# Patient Record
Sex: Male | Born: 1965 | Race: White | Hispanic: No | Marital: Married | Smoking: Never smoker
Health system: Southern US, Community
[De-identification: ages and names within clinical notes are randomized; demographics above are authoritative.]

---

## 2001-09-16 ENCOUNTER — Emergency Department (HOSPITAL_COMMUNITY): Admission: EM | Admit: 2001-09-16 | Discharge: 2001-09-16 | Payer: Self-pay | Admitting: Emergency Medicine

## 2001-09-16 ENCOUNTER — Encounter: Payer: Self-pay | Admitting: Emergency Medicine

## 2007-05-13 ENCOUNTER — Emergency Department (HOSPITAL_COMMUNITY): Admission: EM | Admit: 2007-05-13 | Discharge: 2007-05-14 | Payer: Self-pay | Admitting: Emergency Medicine

## 2012-10-17 ENCOUNTER — Emergency Department (HOSPITAL_COMMUNITY): Payer: Worker's Compensation | Admitting: Anesthesiology

## 2012-10-17 ENCOUNTER — Encounter (HOSPITAL_COMMUNITY): Payer: Self-pay | Admitting: *Deleted

## 2012-10-17 ENCOUNTER — Encounter (HOSPITAL_COMMUNITY): Payer: Self-pay | Admitting: Anesthesiology

## 2012-10-17 ENCOUNTER — Emergency Department (HOSPITAL_COMMUNITY)
Admission: EM | Admit: 2012-10-17 | Discharge: 2012-10-18 | Disposition: A | Discharge status: Home / Self Care | Payer: Worker's Compensation | Attending: Emergency Medicine | Admitting: Emergency Medicine

## 2012-10-17 ENCOUNTER — Encounter (HOSPITAL_COMMUNITY)
Admission: EM | Disposition: A | Discharge status: Home / Self Care | Payer: Self-pay | Source: Home / Self Care | Attending: Emergency Medicine

## 2012-10-17 ENCOUNTER — Emergency Department (HOSPITAL_COMMUNITY): Payer: Worker's Compensation

## 2012-10-17 ENCOUNTER — Inpatient Hospital Stay: Admit: 2012-10-17 | Payer: Self-pay | Admitting: Orthopedic Surgery

## 2012-10-17 DIAGNOSIS — IMO0002 Reserved for concepts with insufficient information to code with codable children: Secondary | ICD-10-CM | POA: Insufficient documentation

## 2012-10-17 DIAGNOSIS — S62639B Displaced fracture of distal phalanx of unspecified finger, initial encounter for open fracture: Secondary | ICD-10-CM | POA: Insufficient documentation

## 2012-10-17 DIAGNOSIS — Y9289 Other specified places as the place of occurrence of the external cause: Secondary | ICD-10-CM | POA: Insufficient documentation

## 2012-10-17 DIAGNOSIS — W230XXA Caught, crushed, jammed, or pinched between moving objects, initial encounter: Secondary | ICD-10-CM | POA: Insufficient documentation

## 2012-10-17 HISTORY — PX: PERCUTANEOUS PINNING: SHX2209

## 2012-10-17 SURGERY — PINNING, EXTREMITY, PERCUTANEOUS
Anesthesia: General | Site: Finger | Laterality: Right | Wound class: Dirty or Infected

## 2012-10-17 MED ORDER — HYDROMORPHONE HCL PF 1 MG/ML IJ SOLN
INTRAMUSCULAR | Status: AC
  Filled 2012-10-17: qty 1

## 2012-10-17 MED ORDER — HYDROCODONE-ACETAMINOPHEN 5-325 MG PO TABS: 1.0000 | ORAL_TABLET | ORAL | Status: DC | PRN

## 2012-10-17 MED ORDER — TETANUS-DIPHTH-ACELL PERTUSSIS 5-2.5-18.5 LF-MCG/0.5 IM SUSP
0.5000 mL | Freq: Once | INTRAMUSCULAR | Status: AC
  Administered 2012-10-17: 0.5 mL via INTRAMUSCULAR
  Filled 2012-10-17: qty 0.5

## 2012-10-17 MED ORDER — BUPIVACAINE-EPINEPHRINE 0.5% -1:200000 IJ SOLN
INTRAMUSCULAR | Status: DC | PRN
  Administered 2012-10-17: 8 mL

## 2012-10-17 MED ORDER — OXYCODONE-ACETAMINOPHEN 5-325 MG PO TABS: 1.0000 | ORAL_TABLET | Freq: Four times a day (QID) | ORAL | Status: DC | PRN

## 2012-10-17 MED ORDER — OXYCODONE-ACETAMINOPHEN 5-325 MG PO TABS
ORAL_TABLET | ORAL | Status: AC
  Filled 2012-10-17: qty 2

## 2012-10-17 MED ORDER — SODIUM CHLORIDE 0.9 % IV SOLN
INTRAVENOUS | Status: DC | PRN
  Administered 2012-10-17: 21:00:00 via INTRAVENOUS

## 2012-10-17 MED ORDER — ONDANSETRON HCL 4 MG/2ML IJ SOLN
INTRAMUSCULAR | Status: DC | PRN
  Administered 2012-10-17: 4 mg via INTRAVENOUS

## 2012-10-17 MED ORDER — CEPHALEXIN 500 MG PO CAPS: 500.0000 mg | ORAL_CAPSULE | Freq: Four times a day (QID) | ORAL | Status: DC

## 2012-10-17 MED ORDER — BUPIVACAINE-EPINEPHRINE (PF) 0.5% -1:200000 IJ SOLN
INTRAMUSCULAR | Status: AC
  Filled 2012-10-17: qty 10

## 2012-10-17 MED ORDER — HYDROMORPHONE HCL PF 1 MG/ML IJ SOLN
0.5000 mg | INTRAMUSCULAR | Status: DC | PRN
  Administered 2012-10-17 (×2): 0.5 mg via INTRAVENOUS

## 2012-10-17 MED ORDER — LACTATED RINGERS IV SOLN: INTRAVENOUS | Status: DC | PRN

## 2012-10-17 MED ORDER — FENTANYL CITRATE 0.05 MG/ML IJ SOLN
INTRAMUSCULAR | Status: DC | PRN
  Administered 2012-10-17: 50 ug via INTRAVENOUS
  Administered 2012-10-17: 100 ug via INTRAVENOUS
  Administered 2012-10-17 (×2): 50 ug via INTRAVENOUS

## 2012-10-17 MED ORDER — LIDOCAINE HCL (CARDIAC) 20 MG/ML IV SOLN
INTRAVENOUS | Status: DC | PRN
  Administered 2012-10-17: 100 mg via INTRAVENOUS

## 2012-10-17 MED ORDER — PROPOFOL 10 MG/ML IV BOLUS
INTRAVENOUS | Status: DC | PRN
  Administered 2012-10-17: 200 mg via INTRAVENOUS

## 2012-10-17 MED ORDER — 0.9 % SODIUM CHLORIDE (POUR BTL) OPTIME
TOPICAL | Status: DC | PRN
  Administered 2012-10-17: 1000 mL

## 2012-10-17 MED ORDER — DEXAMETHASONE SODIUM PHOSPHATE 4 MG/ML IJ SOLN
INTRAMUSCULAR | Status: DC | PRN
  Administered 2012-10-17: 4 mg via INTRAVENOUS

## 2012-10-17 MED ORDER — OXYCODONE-ACETAMINOPHEN 5-325 MG PO TABS
1.0000 | ORAL_TABLET | ORAL | Status: DC | PRN
  Administered 2012-10-17: 1 via ORAL

## 2012-10-17 MED ORDER — CEFAZOLIN SODIUM 1-5 GM-% IV SOLN
1.0000 g | Freq: Once | INTRAVENOUS | Status: AC
  Administered 2012-10-17: 1 g via INTRAVENOUS
  Filled 2012-10-17: qty 50

## 2012-10-17 SURGICAL SUPPLY — 28 items
BANDAGE ELASTIC 3 VELCRO ST LF (GAUZE/BANDAGES/DRESSINGS) ×3 IMPLANT
BANDAGE ELASTIC 4 VELCRO ST LF (GAUZE/BANDAGES/DRESSINGS) IMPLANT
BANDAGE GAUZE ELAST BULKY 4 IN (GAUZE/BANDAGES/DRESSINGS) ×1 IMPLANT
BLADE SURG ROTATE 9660 (MISCELLANEOUS) IMPLANT
BNDG COHESIVE 1X5 TAN STRL LF (GAUZE/BANDAGES/DRESSINGS) ×2 IMPLANT
CHLORAPREP W/TINT 10.5 ML (MISCELLANEOUS) ×1 IMPLANT
CLOTH BEACON ORANGE TIMEOUT ST (SAFETY) ×3 IMPLANT
COVER SURGICAL LIGHT HANDLE (MISCELLANEOUS) ×3 IMPLANT
CUFF TOURNIQUET SINGLE 18IN (TOURNIQUET CUFF) IMPLANT
CUFF TOURNIQUET SINGLE 24IN (TOURNIQUET CUFF) ×2 IMPLANT
DRSG EMULSION OIL 3X3 NADH (GAUZE/BANDAGES/DRESSINGS) ×2 IMPLANT
GAUZE XEROFORM 1X8 LF (GAUZE/BANDAGES/DRESSINGS) IMPLANT
GLOVE BIO SURGEON STRL SZ7.5 (GLOVE) ×3 IMPLANT
GLOVE BIOGEL PI IND STRL 8 (GLOVE) ×2 IMPLANT
GLOVE BIOGEL PI INDICATOR 8 (GLOVE) ×1
GOWN STRL NON-REIN LRG LVL3 (GOWN DISPOSABLE) ×6 IMPLANT
KIT BASIN OR (CUSTOM PROCEDURE TRAY) ×3 IMPLANT
KIT ROOM TURNOVER OR (KITS) ×3 IMPLANT
MANIFOLD NEPTUNE II (INSTRUMENTS) ×1 IMPLANT
NS IRRIG 1000ML POUR BTL (IV SOLUTION) ×3 IMPLANT
PACK ORTHO EXTREMITY (CUSTOM PROCEDURE TRAY) ×3 IMPLANT
PAD ARMBOARD 7.5X6 YLW CONV (MISCELLANEOUS) ×6 IMPLANT
SPONGE GAUZE 4X4 12PLY (GAUZE/BANDAGES/DRESSINGS) ×2 IMPLANT
SUT CHROMIC 6 0 TG140 8 (SUTURE) ×2 IMPLANT
SUT VICRYL RAPIDE 4/0 PS 2 (SUTURE) ×2 IMPLANT
TOWEL OR 17X24 6PK STRL BLUE (TOWEL DISPOSABLE) ×3 IMPLANT
TOWEL OR 17X26 10 PK STRL BLUE (TOWEL DISPOSABLE) ×3 IMPLANT
WATER STERILE IRR 1000ML POUR (IV SOLUTION) ×1 IMPLANT

## 2012-10-17 NOTE — Anesthesia Postprocedure Evaluation (Signed)
  Anesthesia Post-op Note  Patient: Hunter Jenkins  Procedure(s) Performed: Procedure(s): PERCUTANEOUS PINNING RIGHT MIDDLE FINGER (Right)  Patient Location: PACU  Anesthesia Type:GA combined with regional for post-op pain  Level of Consciousness: awake and alert   Airway and Oxygen Therapy: Patient Spontanous Breathing  Post-op Pain: none  Post-op Assessment: Post-op Vital signs reviewed, Patient's Cardiovascular Status Stable, Respiratory Function Stable, Patent Airway, No signs of Nausea or vomiting and Pain level controlled  Post-op Vital Signs: Reviewed and stable  Complications: No apparent anesthesia complications

## 2012-10-17 NOTE — ED Notes (Signed)
Patient is having his hand soaked with sterile water and betadine.  He is comfortable at this time.

## 2012-10-17 NOTE — ED Notes (Signed)
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

## 2012-10-17 NOTE — ED Provider Notes (Signed)
CSN: [id number]     Arrival date & time 10/17/12  1631 History   First MD Initiated Contact with Patient 10/17/12 1709     Chief Complaint  Patient presents with  . Finger Injury   (Consider location/radiation/quality/duration/timing/severity/associated sxs/prior Treatment) HPI Comments: 47 year old male presenting to the emergency department with a right finger injury occurring around 4:00 PM today, about an hour and a half prior to arrival to the emergency department. 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 Currently states his pain is not that bad, his finger feels slightly numb. Believes his last tetanus shot was about 5 years ago.  The history is provided by the patient.    History reviewed. No pertinent past medical history. History reviewed. No pertinent past surgical history. History reviewed. No pertinent family history. History  Substance Use Topics  . Smoking status: Never Smoker   . Smokeless tobacco: Not on file  . Alcohol Use: No    Review of Systems  Gastrointestinal: Negative for nausea.  Skin: Positive for wound.  Neurological: Positive for numbness. Negative for light-headedness.  All other systems reviewed and are negative.    Allergies  Aleve  Home Medications  No current outpatient prescriptions on file. BP 165/94  Pulse 89  Temp(Src) 98 F (36.7 C) (Oral)  Resp 18  Ht 6\' 1"  (1.854 m)  Wt 263 lb (119.296 kg)  BMI 34.71 kg/m2  SpO2 96% Physical Exam  Nursing note and vitals reviewed. Constitutional: He is oriented to person, place, and time. He appears well-developed and well-nourished.  HENT:  Head: Normocephalic and atraumatic.  Eyes: Conjunctivae are normal.  Neck: Normal range of motion. Neck supple.  Cardiovascular: Normal rate, regular rhythm, normal heart sounds, intact distal pulses and normal pulses.   Pulmonary/Chest: Effort  normal and breath sounds normal.  Musculoskeletal:       Right hand: He exhibits laceration. Normal sensation noted.       Hands: Full flexion and extension of DIP of right middle finger.  Neurological: He is alert and oriented to person, place, and time. No sensory deficit.  Psychiatric: He has a normal mood and affect. His behavior is normal.    ED Course  Procedures (including critical care time) Digital block, 4 cc xylocaine Labs Review Labs Reviewed - No data to display Imaging Review Dg Finger Middle Right  10/17/2012   CLINICAL DATA:  Right middle finger pain following an injury.  EXAM: RIGHT MIDDLE FINGER 2+V  COMPARISON:  None.  FINDINGS: Transverse fracture through the proximal aspect of the 3rd distal tuft and adjacent distal aspect of the 3rd distal phalanx. This is mildly comminuted with 1 shaft width of dorsal displacement of the distal fragment. Overlying bandage material.  IMPRESSION: Comminuted 3rd distal phalanx fracture, as described above.   Electronically Signed   By: Gordan Payment   On: 10/17/2012 17:19    MDM   1. Phalanx, distal fracture of finger, open, initial encounter     Patient with laceration to the distal tip of his right middle finger with comminuted distal phalanx fracture. Partial amputation. Digital block given, wound cleaned. I spoke with Dr. Janee Morn, hand surgeon who looked at the x-ray and will come to the emergency department to evaluate patient himself. Neurovascularly intact. He is refusing any pain medication at this time. Patient also evaluated by my attending Dr. Romeo Apple who agrees with  plan of care.  8:57 PM Patient evaluated by Dr. Janee Morn who will take patient to OR for surgery. IV ancef.   Trevor Mace, PA-C 10/17/12 2059  Trevor Mace, PA-C 10/17/12 2109

## 2012-10-17 NOTE — ED Notes (Signed)
Janee Morn, MD is at the bedside.

## 2012-10-17 NOTE — Anesthesia Preprocedure Evaluation (Addendum)
Anesthesia Evaluation  Patient identified by MRN, date of birth, ID band Patient awake    Reviewed: Allergy & Precautions, H&P , NPO status , Patient's Chart, lab work & pertinent test results  History of Anesthesia Complications (+) PONV  Airway Mallampati: II TM Distance: >3 FB Neck ROM: Full    Dental   Pulmonary  breath sounds clear to auscultation        Cardiovascular Rhythm:Regular Rate:Normal     Neuro/Psych    GI/Hepatic   Endo/Other    Renal/GU      Musculoskeletal   Abdominal (+) + obese,   Peds  Hematology   Anesthesia Other Findings   Reproductive/Obstetrics                         Anesthesia Physical Anesthesia Plan  ASA: I and emergent  Anesthesia Plan: General   Post-op Pain Management:    Induction: Intravenous  Airway Management Planned: LMA  Additional Equipment:   Intra-op Plan:   Post-operative Plan: Extubation in OR  Informed Consent: I have reviewed the patients History and Physical, chart, labs and discussed the procedure including the risks, benefits and alternatives for the proposed anesthesia with the patient or authorized representative who has indicated his/her understanding and acceptance.     Plan Discussed with: CRNA and Surgeon  Anesthesia Plan Comments:        Anesthesia Quick Evaluation

## 2012-10-17 NOTE — Op Note (Signed)
10/17/2012  10:30 PM  PATIENT:  Hunter Jenkins  47 y.o. male  PRE-OPERATIVE DIAGNOSIS:  Right long finger incomplete tip amputation  POST-OPERATIVE DIAGNOSIS:  Same  PROCEDURE:   1. Right long finger nail plate avulsion    2. Right long finger nail bed repair    3. Right long finger debridement of open fracture to include skin subcutaneous tissue and bone    4. Right long finger ORIF distal phalanx fracture  SURGEON: Cliffton Asters. Janee Morn, MD  PHYSICIAN ASSISTANT: None  ANESTHESIA:  general  SPECIMENS:  None  DRAINS:   None  PREOPERATIVE INDICATIONS:  Hunter Jenkins is a  47 y.o. male with a dramatic incomplete amputation of right long finger  The risks benefits and alternatives were discussed with the patient preoperatively including but not limited to the risks of infection, bleeding, nerve injury, cardiopulmonary complications, the need for revision surgery, among others, and the patient verbalized understanding and consented to proceed.  OPERATIVE IMPLANTS: 0.045 inch K wire x2   OPERATIVE FINDINGS: Dorsal disruption through the junction where terminal matrix need sterile matrix. Nailbed laceration was complex and stellate through this area  OPERATIVE PROCEDURE:  After receiving prophylactic antibiotics, the patient was escorted to the operative theatre and placed in a supine position.  General anesthesia was administered. A surgical "time-out" was performed during which the planned procedure, proposed operative site, and the correct patient identity were compared to the operative consent and agreement confirmed by the circulating nurse according to current facility policy.  Following application of a tourniquet to the operative extremity, the exposed skin was prepped with a Hibiclens scrub brush before being formally prepped with Betadine and draped in the usual sterile fashion.  The tourniquet inflated to approximately higher than systolic BP.  The nail plate was removed from  the distal fragment by using a Freer to separate it from the nail bed. It was cleaned of any cleaning debris and placed on the back table in saline. The fracture site was debrided. There was some cancellous bone fragments that needed to be removed. Skin edges were trimmed. Subcutaneous tissue was debrided. The wound is copiously irrigated. A 0.045 inch K wire was then driven retrograde out the distal end so that the fracture to be reduced and it to be driven back across into the base fragment up to the subchondral bone. A second K wire was placed from distal toward proximal crossing, securing the fracture nicely. Alignment was near-anatomic. Final fluoroscopic images were obtained. The soft tissues adjacent to the nailbed  were repaired with 4-0 Vicryl Rapide interrupted sutures. The nail bed repair was performed with 4 power loupe magnification and 6-0 chromic suture. The nail plate was then placed back into the depths of the nail fold and secured distally on the nail folds with 4-0 Vicryl Rapide interrupted sutures. The K wires were bent over distally to lie dorsally adjacent to the nail and clipped. The tourniquet was released and a dressing applied with a dorsal tongue blade that did not immobilize the PIP joint.  He was awakened and taken to the recovery room in stable condition, breathing spontaneously  DISPOSITION: He will be discharged home today returning in 10-15 days for reevaluation, with new x-rays of the right long finger in the splint and a companion appointment with hand therapy to construct a fingertip splint.

## 2012-10-17 NOTE — Transfer of Care (Signed)
Immediate Anesthesia Transfer of Care Note  Patient: Hunter Jenkins  Procedure(s) Performed: Procedure(s): PERCUTANEOUS PINNING RIGHT MIDDLE FINGER (Right)  Patient Location: PACU  Anesthesia Type:General  Level of Consciousness: awake and alert   Airway & Oxygen Therapy: Patient Spontanous Breathing and Patient connected to nasal cannula oxygen  Post-op Assessment: Report given to PACU RN, Post -op Vital signs reviewed and stable and Patient moving all extremities X 4  Post vital signs: Reviewed and stable  Complications: No apparent anesthesia complications

## 2012-10-17 NOTE — Consult Note (Signed)
  ORTHOPAEDIC CONSULTATION HISTORY & PHYSICAL REQUESTING PHYSICIAN: Hunter Argyle, MD  Chief Complaint: Right long fingertip injury  HPI: Hunter Jenkins is a 47 y.o. male who was at work and was spinning entire, getting the tip of the long finger caught in the limb. It caused an incomplete amputation of the tip through the distal phalanx.  History reviewed. No pertinent past medical history. History reviewed. No pertinent past surgical history. History   Social History  . Marital Status: Married    Spouse Name: N/A    Number of Children: N/A  . Years of Education: N/A   Social History Main Topics  . Smoking status: Never Smoker   . Smokeless tobacco: None  . Alcohol Use: No  . Drug Use: No  . Sexual Activity: None   Other Topics Concern  . None   Social History Narrative  . None   History reviewed. No pertinent family history. Allergies  Allergen Reactions  . Aleve [Naproxen Sodium] Rash    Pt has taken higher doses of generic naproxen with no reaction   Prior to Admission medications   Medication Sig Start Date End Date Taking? Authorizing Provider  diphenhydramine-acetaminophen (TYLENOL PM) 25-500 MG TABS Take 2 tablets by mouth at bedtime as needed (pain/sleep).   Yes Historical Provider, MD   Dg Finger Middle Right  10/17/2012   CLINICAL DATA:  Right middle finger pain following an injury.  EXAM: RIGHT MIDDLE FINGER 2+V  COMPARISON:  None.  FINDINGS: Transverse fracture through the proximal aspect of the 3rd distal tuft and adjacent distal aspect of the 3rd distal phalanx. This is mildly comminuted with 1 shaft width of dorsal displacement of the distal fragment. Overlying bandage material.  IMPRESSION: Comminuted 3rd distal phalanx fracture, as described above.   Electronically Signed   By: Gordan Payment   On: 10/17/2012 17:19    Positive ROS: All other systems have been reviewed and were otherwise negative with the exception of those mentioned in the HPI and as  above.  Physical Exam: Vitals: Refer to EMR. Constitutional:  WD, WN, NAD HEENT:  NCAT, EOMI Neuro/Psych:  Alert & oriented to person, place, and time; appropriate mood & affect Lymphatic: No generalized UE edema or lymphadenopathy Extremities / MSK:  The extremities are normal with respect to appearance, ranges of motion, joint stability, muscle strength/tone, sensation, & perfusion except as otherwise noted:   Right long finger has a circumferential 270 breech in the soft tissues with a volar ulnar bridge intact. The part has decent turgor. It is unclear at what level this affected the nail bed, whether germinal or sterile matrix. The nail is intact to the distal piece and has become avulsed from the proximal piece. Flexor and extensor tendons are intact  Assessment: Right long finger incomplete amputation through distal phalanx  Plan: I discussed these findings with the patient and recommended exploration in the operating room with repair of structures as indicated to include pinning of the fracture and repair the soft tissues. We discussed the possibility that the part may go on to die from poor perfusion, require revision surgery or even completion of the amputation. Nonetheless, it is prudent tonight to try to salvage the finger with direct repair.  [name], MD     Mobile [phone] Orthopaedic & Hand Surgery [facility] Orthopaedic & Sports Medicine [facility] [address] [place], [place]  [address] [phone]

## 2012-10-17 NOTE — Anesthesia Procedure Notes (Signed)
Procedure Name: LMA Insertion Date/Time: 10/17/2012 9:31 PM Performed by: Orvilla Fus A Pre-anesthesia Checklist: Patient identified, Timeout performed, Emergency Drugs available, Suction available and Patient being monitored Patient Re-evaluated:Patient Re-evaluated prior to inductionOxygen Delivery Method: Circle system utilized Preoxygenation: Pre-oxygenation with 100% oxygen Intubation Type: IV induction LMA: LMA inserted LMA Size: 5.0 Number of attempts: 1 Placement Confirmation: positive ETCO2 and breath sounds checked- equal and bilateral Tube secured with: Tape Dental Injury: Teeth and Oropharynx as per pre-operative assessment

## 2012-10-18 MED ORDER — ONDANSETRON HCL 4 MG/2ML IJ SOLN
INTRAMUSCULAR | Status: AC
  Filled 2012-10-18: qty 2

## 2012-10-18 MED ORDER — ONDANSETRON HCL 4 MG/2ML IJ SOLN: 4.0000 mg | Freq: Once | INTRAMUSCULAR | Status: DC

## 2012-10-18 NOTE — OR Nursing (Signed)
Pt w/ N/V before tx.   Zofran 4mg  IV given per orders.

## 2012-10-18 NOTE — ED Provider Notes (Signed)
Medical screening examination/treatment/procedure(s) were conducted as a shared visit with non-physician practitioner(s) and myself.  I personally evaluated the patient during the encounter  I interviewed and examined the patient. Lungs are CTAB. Cardiac exam wnl. Abdomen soft.  Partial amputation of right long finger. Consulted hand surg. Pt taken to OR.    [name], MD 10/18/12 1359

## 2012-10-22 ENCOUNTER — Encounter (HOSPITAL_COMMUNITY): Payer: Self-pay | Admitting: Orthopedic Surgery

## 2013-09-11 ENCOUNTER — Encounter (HOSPITAL_COMMUNITY): Payer: Self-pay | Admitting: Emergency Medicine

## 2013-09-11 ENCOUNTER — Emergency Department (INDEPENDENT_AMBULATORY_CARE_PROVIDER_SITE_OTHER)
Admission: EM | Admit: 2013-09-11 | Discharge: 2013-09-11 | Disposition: A | Discharge status: Home / Self Care | Payer: BC Managed Care – PPO | Source: Home / Self Care

## 2013-09-11 DIAGNOSIS — S61409A Unspecified open wound of unspecified hand, initial encounter: Secondary | ICD-10-CM

## 2013-09-11 DIAGNOSIS — S61411A Laceration without foreign body of right hand, initial encounter: Secondary | ICD-10-CM

## 2013-09-11 NOTE — ED Provider Notes (Signed)
CSN: [id number]     Arrival date & time 09/11/13  1810 History   First MD Initiated Contact with Patient 09/11/13 1828     Chief Complaint  Patient presents with  . Laceration   (Consider location/radiation/quality/duration/timing/severity/associated sxs/prior Treatment) HPI Comments: 48 year old male was at his job trying to pull a tire off a metal support object. The right hand brushed across the edge of the metal producing a 3 cm laceration to the dorsum of the right hand.   History reviewed. No pertinent past medical history. Past Surgical History  Procedure Laterality Date  . Percutaneous pinning Right 10/17/2012    Procedure: PERCUTANEOUS PINNING RIGHT MIDDLE FINGER;  Surgeon: Jodi Marbleavid A Thompson, MD;  Location: [facility] OR;  Service: Orthopedics;  Laterality: Right;   History reviewed. No pertinent family history. History  Substance Use Topics  . Smoking status: Never Smoker   . Smokeless tobacco: Not on file  . Alcohol Use: No    Review of Systems  Skin: Positive for wound.  All other systems reviewed and are negative.   Allergies  Aleve  Home Medications   Prior to Admission medications   Medication Sig Start Date End Date Taking? Authorizing Provider  triamcinolone (NASACORT) 55 MCG/ACT AERO nasal inhaler Place 2 sprays into the nose daily.   Yes Historical Provider, MD   BP 167/93  Pulse 80  Temp(Src) 99 F (37.2 C) (Oral)  Resp 20  SpO2 96% Physical Exam  Nursing note and vitals reviewed. Constitutional: He is oriented to person, place, and time. He appears well-developed and well-nourished. No distress.  Neck: Normal range of motion. Neck supple.  Cardiovascular: Normal rate.   Pulmonary/Chest: Effort normal. No respiratory distress.  Musculoskeletal:  Full extension and flexion against resistance of the right index finger and thumb. Full range of motion. Distal neurovascular motor sensory is intact. Capillary refill brisk.  Neurological: He is alert and  oriented to person, place, and time. He exhibits normal muscle tone.  Skin: Skin is warm and dry.  3 cm laceration to the dorsum of the right hand just proximal to the first interdigital web space. It is superficial. The laceration includes the dermis and a portion of the subcutaneous tissue but does not involve tendons.  Psychiatric: He has a normal mood and affect.    ED Course  LACERATION REPAIR Date/Time: 09/11/2013 7:23 PM Performed by: Phineas RealMABE, Yalda Herd Authorized by: Leslee HomeKELLER, Jasan Doughtie C Consent: Verbal consent obtained. Risks and benefits: risks, benefits and alternatives were discussed Consent given by: patient Patient understanding: patient states understanding of the procedure being performed Patient identity confirmed: verbally with patient Body area: upper extremity Location details: right hand Laceration length: 3 cm Tendon involvement: none Nerve involvement: none Vascular damage: no Anesthesia: local infiltration Local anesthetic: lidocaine 1% with epinephrine Anesthetic total: 7 ml Preparation: Patient was prepped and draped in the usual sterile fashion. Irrigation solution: saline Irrigation method: jet lavage Amount of cleaning: standard Debridement: none Degree of undermining: none Skin closure: 4-0 nylon Number of sutures: 7 Technique: simple Approximation: close Approximation difficulty: simple Dressing: 4x4 sterile gauze Patient tolerance: Patient tolerated the procedure well with no immediate complications.   (including critical care time) Labs Review Labs Reviewed - No data to display  Imaging Review No results found.   MDM   1. Laceration of hand without complication, excluding fingers, right, initial encounter    Sutured laceration RTO 10 d for suture removal, sooner for problems    Hayden Rasmussenavid Mckinsley Koelzer, NP 09/11/13 1925  Onalee Huaavid  Doyne Micke, NP 09/11/13 1926

## 2013-09-11 NOTE — ED Provider Notes (Signed)
Medical screening examination/treatment/procedure(s) were performed by non-physician practitioner and as supervising physician I was immediately available for consultation/collaboration.  Caera Enwright, M.D.  Qiara Minetti C Agnes Probert, MD 09/11/13 2259 

## 2013-09-11 NOTE — Discharge Instructions (Signed)
Laceration Care, Adult °A laceration is a cut or lesion that goes through all layers of the skin and into the tissue just beneath the skin. °TREATMENT  °Some lacerations may not require closure. Some lacerations may not be able to be closed due to an increased risk of infection. It is important to see your caregiver as soon as possible after an injury to minimize the risk of infection and maximize the opportunity for successful closure. °If closure is appropriate, pain medicines may be given, if needed. The wound will be cleaned to help prevent infection. Your caregiver will use stitches (sutures), staples, wound glue (adhesive), or skin adhesive strips to repair the laceration. These tools bring the skin edges together to allow for faster healing and a better cosmetic outcome. However, all wounds will heal with a scar. Once the wound has healed, scarring can be minimized by covering the wound with sunscreen during the day for 1 full year. °HOME CARE INSTRUCTIONS  °For sutures or staples: °· Keep the wound clean and dry. °· If you were given a bandage (dressing), you should change it at least once a day. Also, change the dressing if it becomes wet or dirty, or as directed by your caregiver. °· Wash the wound with soap and water 2 times a day. Rinse the wound off with water to remove all soap. Pat the wound dry with a clean towel. °· After cleaning, apply a thin layer of the antibiotic ointment as recommended by your caregiver. This will help prevent infection and keep the dressing from sticking. °· You may shower as usual after the first 24 hours. Do not soak the wound in water until the sutures are removed. °· Only take over-the-counter or prescription medicines for pain, discomfort, or fever as directed by your caregiver. °· Get your sutures or staples removed as directed by your caregiver. °For skin adhesive strips: °· Keep the wound clean and dry. °· Do not get the skin adhesive strips wet. You may bathe  carefully, using caution to keep the wound dry. °· If the wound gets wet, pat it dry with a clean towel. °· Skin adhesive strips will fall off on their own. You may trim the strips as the wound heals. Do not remove skin adhesive strips that are still stuck to the wound. They will fall off in time. °For wound adhesive: °· You may briefly wet your wound in the shower or bath. Do not soak or scrub the wound. Do not swim. Avoid periods of heavy perspiration until the skin adhesive has fallen off on its own. After showering or bathing, gently pat the wound dry with a clean towel. °· Do not apply liquid medicine, cream medicine, or ointment medicine to your wound while the skin adhesive is in place. This may loosen the film before your wound is healed. °· If a dressing is placed over the wound, be careful not to apply tape directly over the skin adhesive. This may cause the adhesive to be pulled off before the wound is healed. °· Avoid prolonged exposure to sunlight or tanning lamps while the skin adhesive is in place. Exposure to ultraviolet light in the first year will darken the scar. °· The skin adhesive will usually remain in place for 5 to 10 days, then naturally fall off the skin. Do not pick at the adhesive film. °You may need a tetanus shot if: °· You cannot remember when you had your last tetanus shot. °· You have never had a tetanus   shot. °If you get a tetanus shot, your arm may swell, get red, and feel warm to the touch. This is common and not a problem. If you need a tetanus shot and you choose not to have one, there is a rare chance of getting tetanus. Sickness from tetanus can be serious. °SEEK MEDICAL CARE IF:  °· You have redness, swelling, or increasing pain in the wound. °· You see a red line that goes away from the wound. °· You have yellowish-white fluid (pus) coming from the wound. °· You have a fever. °· You notice a bad smell coming from the wound or dressing. °· Your wound breaks open before or  after sutures have been removed. °· You notice something coming out of the wound such as wood or glass. °· Your wound is on your hand or foot and you cannot move a finger or toe. °SEEK IMMEDIATE MEDICAL CARE IF:  °· Your pain is not controlled with prescribed medicine. °· You have severe swelling around the wound causing pain and numbness or a change in color in your arm, hand, leg, or foot. °· Your wound splits open and starts bleeding. °· You have worsening numbness, weakness, or loss of function of any joint around or beyond the wound. °· You develop painful lumps near the wound or on the skin anywhere on your body. °MAKE SURE YOU:  °· Understand these instructions. °· Will watch your condition. °· Will get help right away if you are not doing well or get worse. °Document Released: 01/09/2005 Document Revised: 04/03/2011 Document Reviewed: 07/05/2010 °[employer]® Patient Information ©2015 [employer]. This information is not intended to replace advice given to you by your health care provider. Make sure you discuss any questions you have with your health care provider. ° °Sutured Wound Care °Sutures are stitches that can be used to close wounds. Caring for your wound can help stop infection and lessen pain. °HOME CARE  °· Rest and raise (elevate) the injured area until the pain and puffiness (swelling) go away. °· Only take medicines as told by your doctor. °· Clean the wound gently with mild soap and water once a day after the first 2 days. Rinse off the soap. Pat the area dry with a clean towel. Do not rub the wound. °· Change the bandage (dressing) as told by your doctor. If the bandage sticks, soak it off with soapy water. Stop using a bandage after 2 days or after the wound stops leaking fluid. °· Put cream on the wound as told by your doctor. °· Do not stretch the wound. °· Drink enough fluids to keep your pee (urine) clear or pale yellow. °· See your doctor to have the sutures removed. °· Use sunscreen or  sunblock on the wound after it heals. °GET HELP RIGHT AWAY IF:  °· Your wound gets red, puffy, hot, or tender. °· You have more pain in the wound. °· You have a red streak that goes away from the wound. °· You see yellowish-white fluid (pus) coming out of the wound. °· You have a fever. °· You have chills and start to shake. °· You notice a bad smell coming from the wound. °· Your wound will not stop bleeding. °MAKE SURE YOU:  °· Understand these instructions. °· Will watch your condition. °· Will get help right away if you are not doing well or get worse. °Document Released: 06/28/2007 Document Revised: 04/03/2011 Document Reviewed: 05/15/2010 °[employer]® Patient Information ©2015 [employer]. This information is   not intended to replace advice given to you by your health care provider. Make sure you discuss any questions you have with your health care provider. ° °

## 2013-09-11 NOTE — ED Notes (Signed)
Reports laceration to the top of the right hand over index finger.  States cut hand on a metal hand dolly.   Pt is up to date on tetanus.

## 2014-11-23 IMAGING — CR DG FINGER MIDDLE 2+V*R*
3 series · 3 of 3 positions shown · non-contrast
Comparison: None.

CLINICAL DATA: Right middle finger pain following an injury.

EXAM:
RIGHT MIDDLE FINGER 2+V

[x finger pa right]
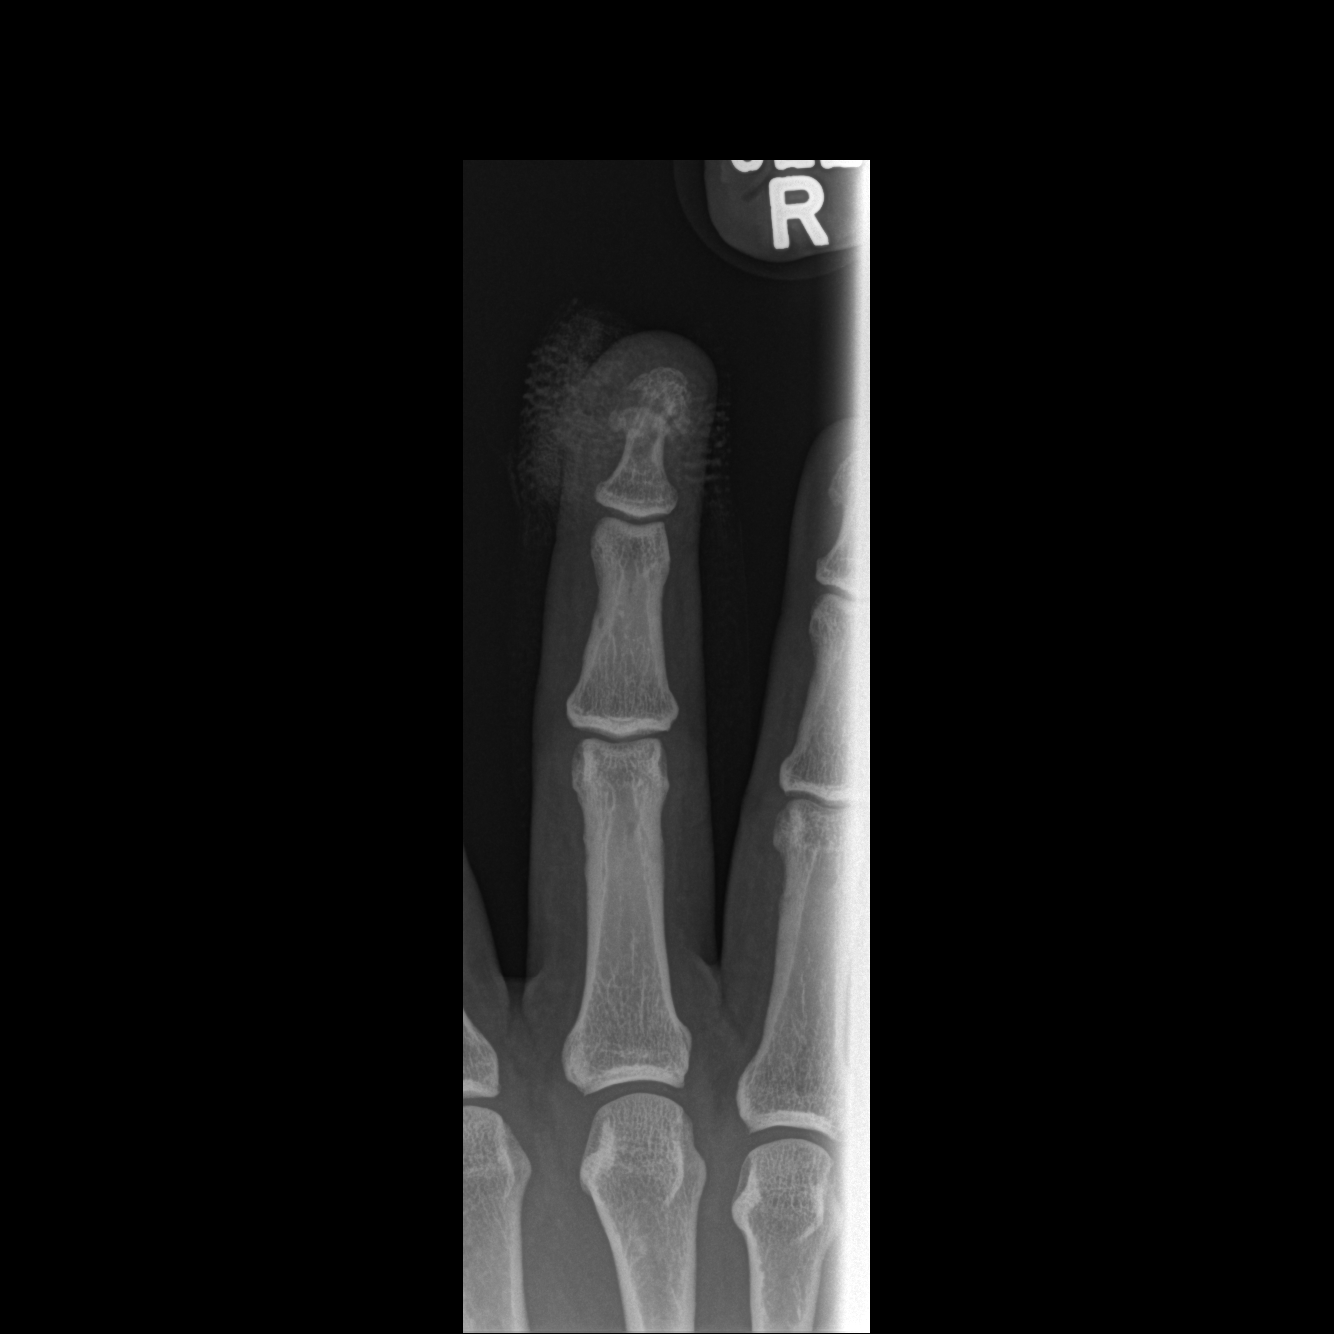

[x finger obl. right]
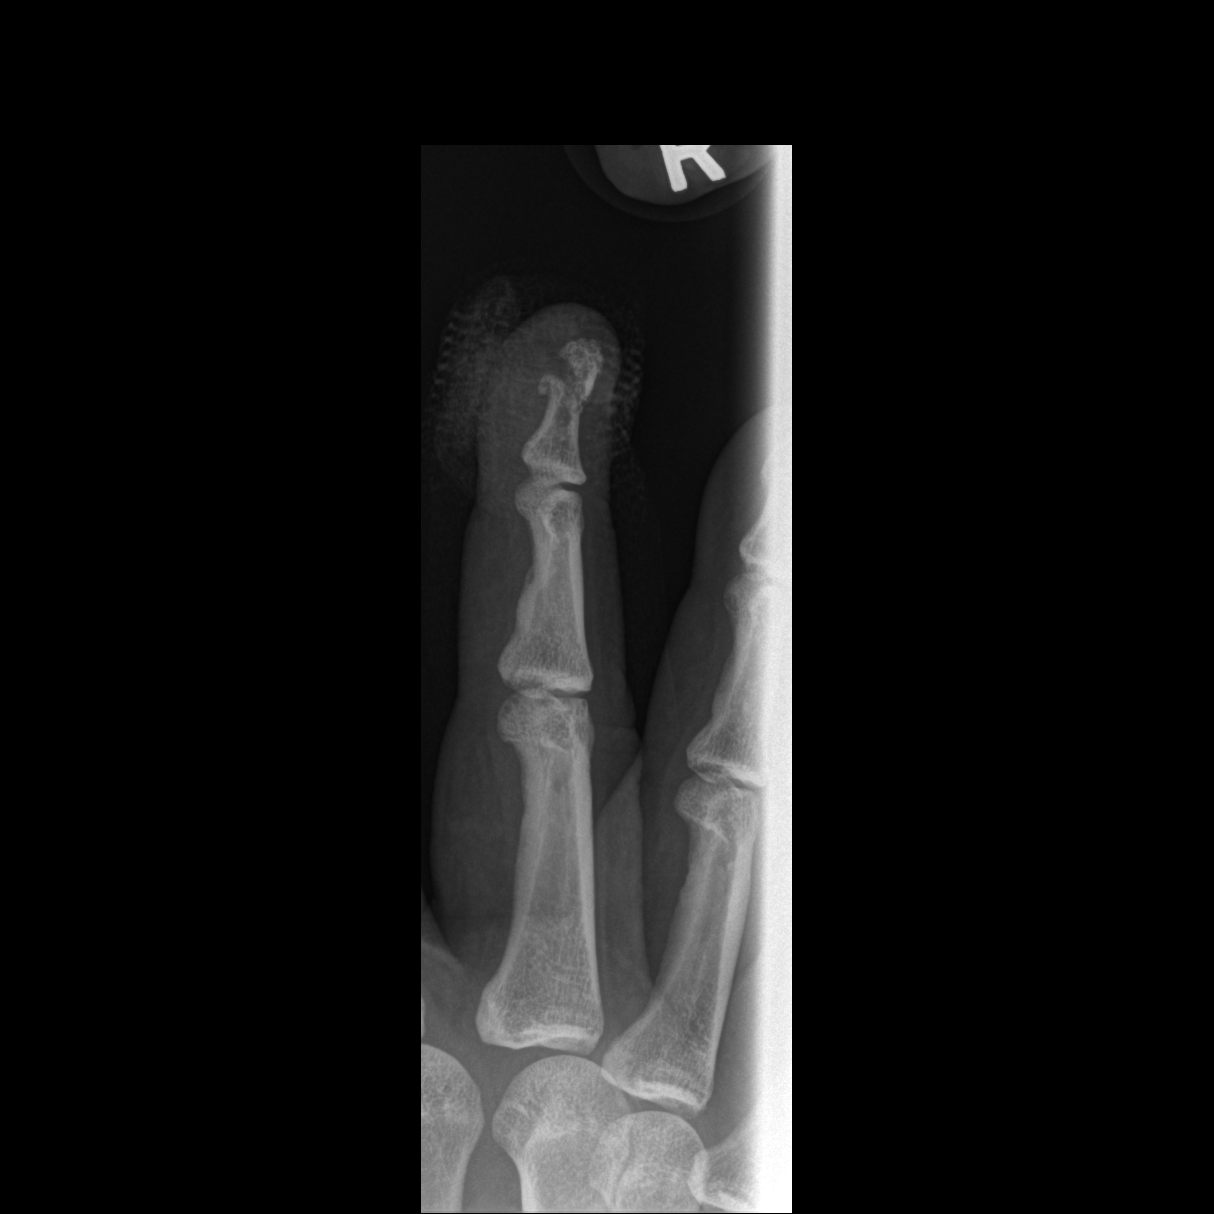

[x finger lateral right]
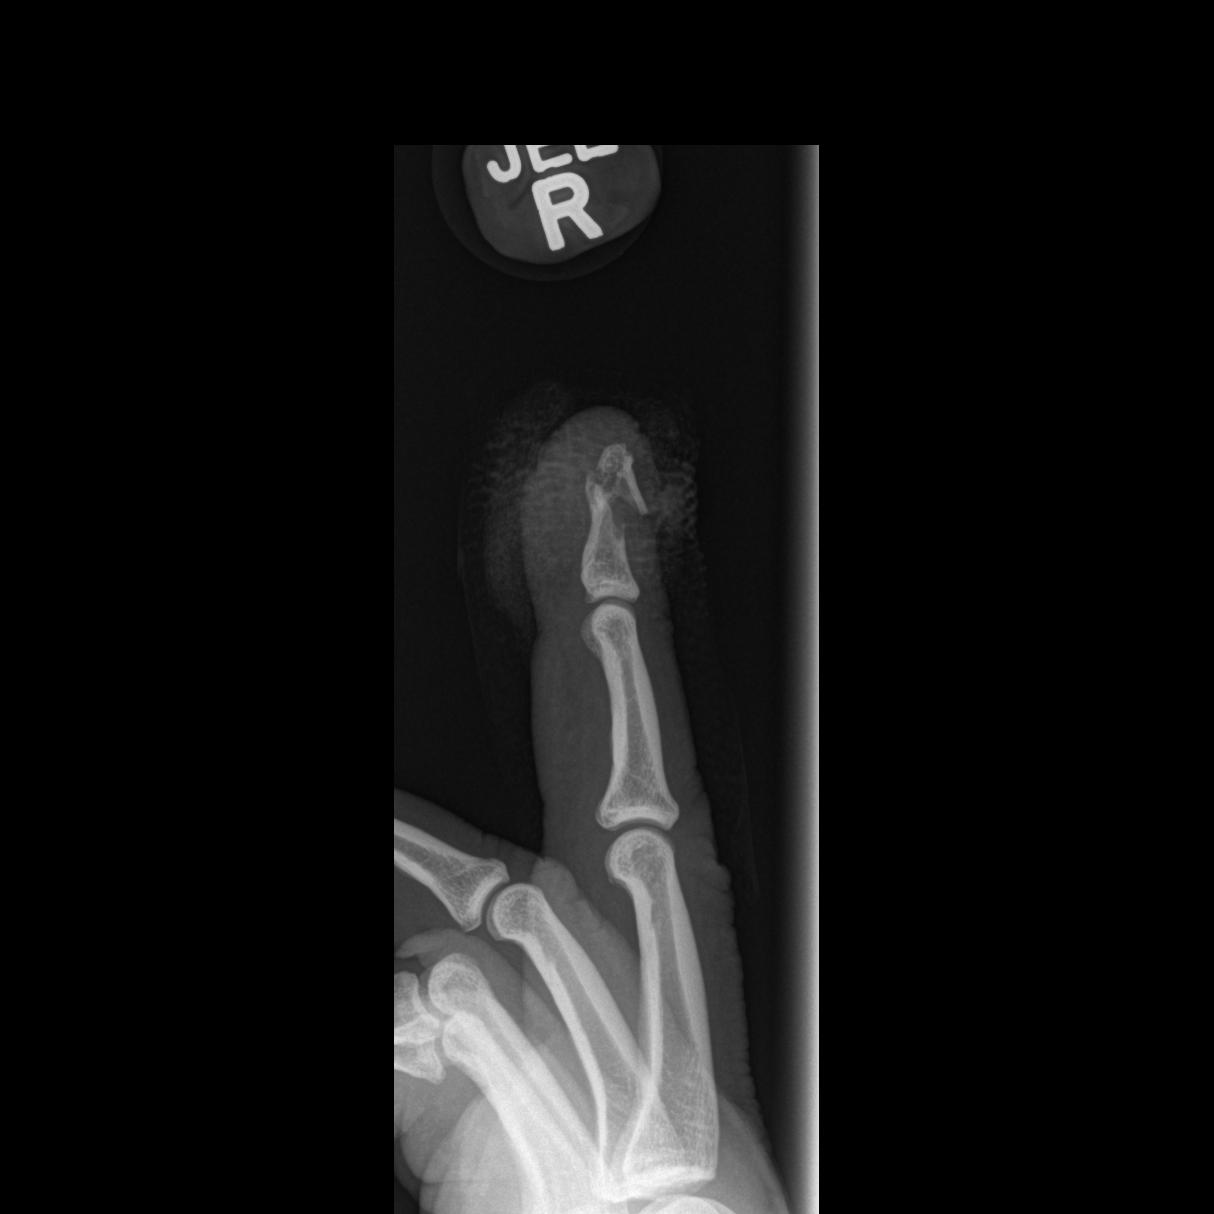

[3 of 3 positions shown; findings below may reference images not displayed]

FINDINGS: Transverse fracture through the proximal aspect of the 3rd distal
tuft and adjacent distal aspect of the 3rd distal phalanx. This is
mildly comminuted with 1 shaft width of dorsal displacement of the
distal fragment. Overlying bandage material.
IMPRESSION: Comminuted 3rd distal phalanx fracture, as described above.

## 2014-11-23 IMAGING — RF DG FINGER MIDDLE 2+V*R*
1 series · 2 of 2 positions shown · non-contrast
Comparison: Plain film 10/17/2012 at 6125 hr.

CLINICAL DATA: Fracture fixation.

EXAM:
RIGHT MIDDLE FINGER 2+V; DG C-ARM 1-60 MIN

[Series 1: run · 2 of 2 slices shown]
[im 1/2]
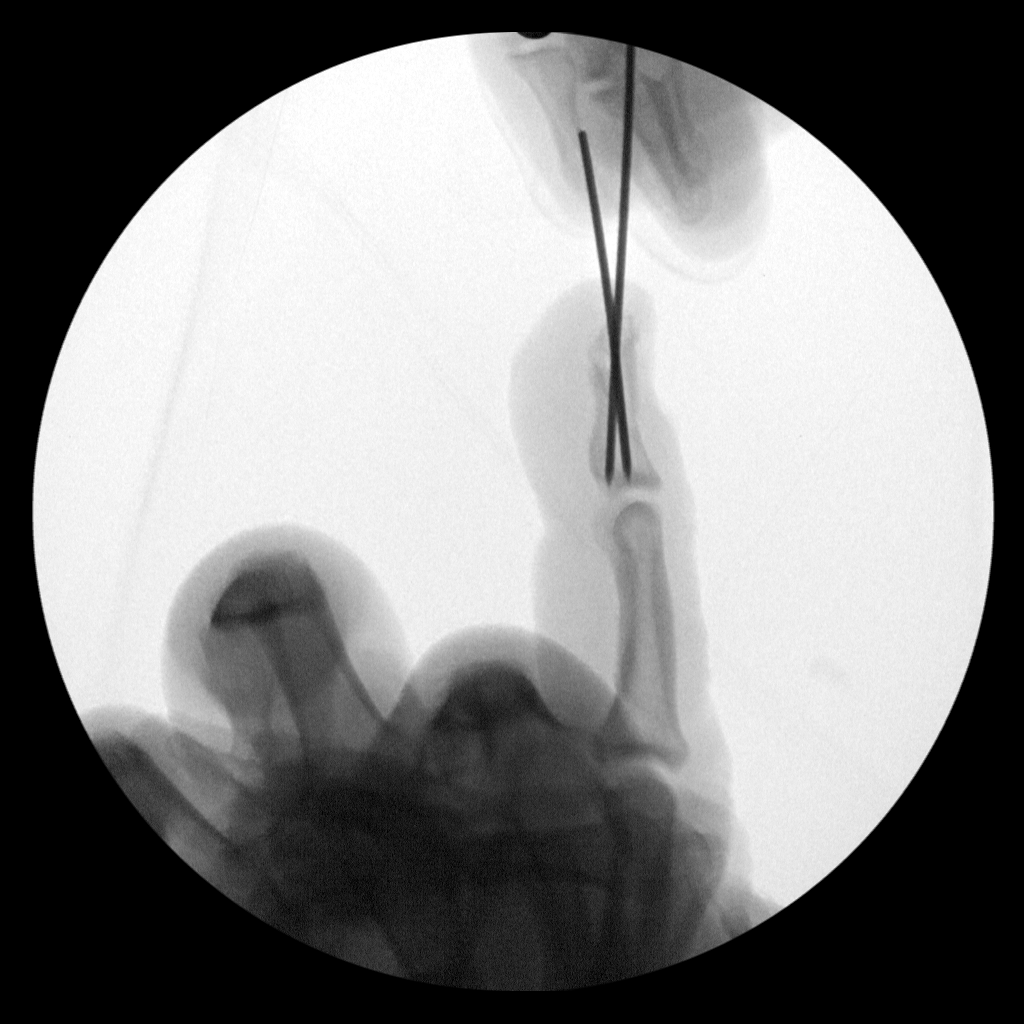
[im 2/2]
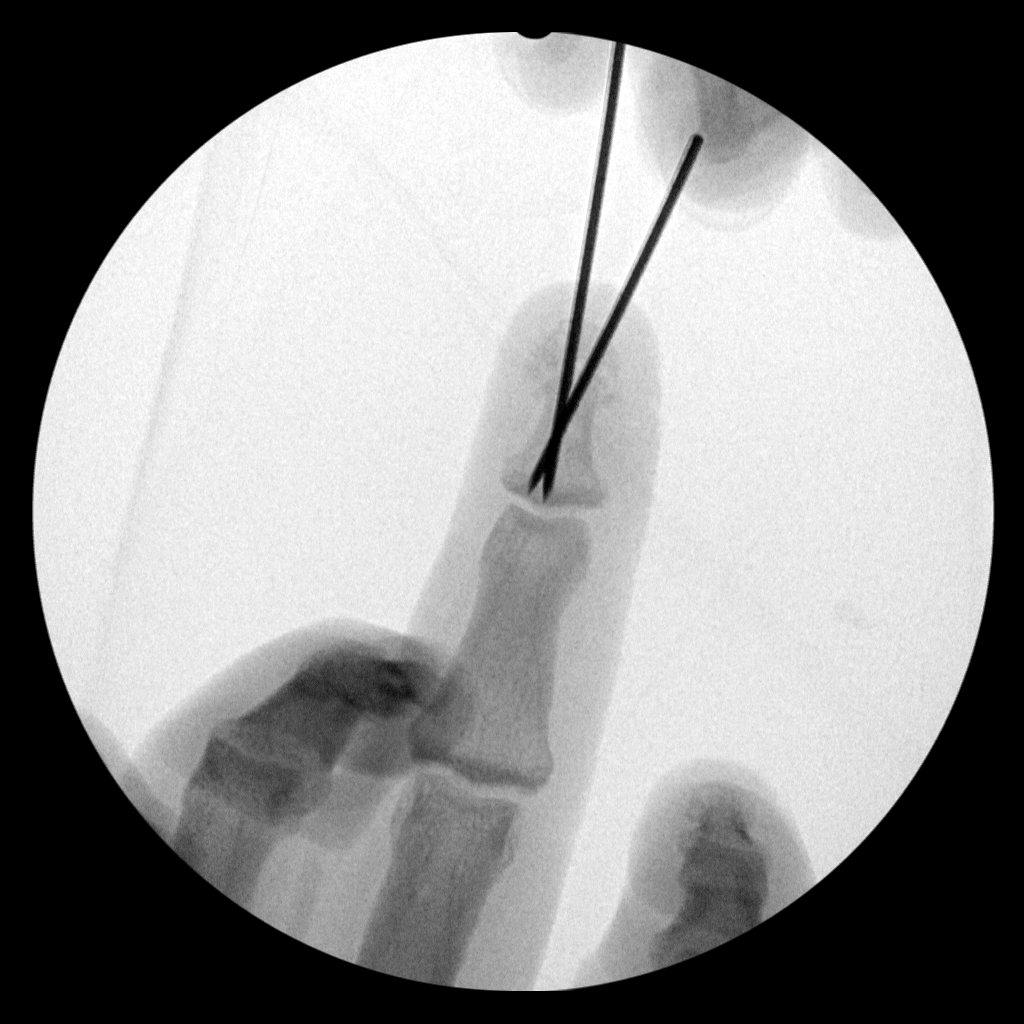

[2 of 2 positions shown; findings below may reference images not displayed]

FINDINGS: Two fluoroscopic spot views of the right long finger are provided.
Images demonstrate placement of 2 crossing K-wires for fixation of a
fracture of the distal phalanx of the right long finger. Position
and alignment are improved. No new abnormality is identified.
IMPRESSION: ORIF fracture of the distal phalanx of the right long finger.
